# Patient Record
Sex: Male | Born: 2000 | Race: Black or African American | Hispanic: No | Marital: Single | Smoking: Current some day smoker
Health system: Southern US, Community
[De-identification: ages and names within clinical notes are randomized; demographics above are authoritative.]

## PROBLEM LIST (undated history)

## (undated) DIAGNOSIS — F329 Major depressive disorder, single episode, unspecified: Secondary | ICD-10-CM

## (undated) DIAGNOSIS — J302 Other seasonal allergic rhinitis: Secondary | ICD-10-CM

## (undated) DIAGNOSIS — J45909 Unspecified asthma, uncomplicated: Secondary | ICD-10-CM

---
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
## 2022-10-04 ENCOUNTER — Emergency Department (HOSPITAL_COMMUNITY)
Admission: EM | Admit: 2022-10-04 | Discharge: 2022-10-04 | Disposition: A | Discharge status: Home / Self Care | Payer: Self-pay | Attending: Emergency Medicine | Admitting: Emergency Medicine

## 2022-10-04 ENCOUNTER — Other Ambulatory Visit: Payer: Self-pay

## 2022-10-04 DIAGNOSIS — J45909 Unspecified asthma, uncomplicated: Secondary | ICD-10-CM | POA: Insufficient documentation

## 2022-10-04 DIAGNOSIS — F17200 Nicotine dependence, unspecified, uncomplicated: Secondary | ICD-10-CM | POA: Insufficient documentation

## 2022-10-04 DIAGNOSIS — J029 Acute pharyngitis, unspecified: Secondary | ICD-10-CM | POA: Insufficient documentation

## 2022-10-04 LAB — GROUP A STREP BY PCR: Group A Strep by PCR: NOT DETECTED

## 2022-10-04 MED ORDER — HYDROXYZINE HCL 25 MG PO TABS: 25.0000 mg | ORAL_TABLET | Freq: Four times a day (QID) | ORAL | 0 refills | Status: AC | PRN

## 2022-10-04 MED ORDER — HYDROXYZINE HCL 25 MG PO TABS
25.0000 mg | ORAL_TABLET | Freq: Once | ORAL | Status: DC
  Filled 2022-10-04: qty 1

## 2022-10-04 MED ORDER — ALUM & MAG HYDROXIDE-SIMETH 200-200-20 MG/5ML PO SUSP
30.0000 mL | Freq: Once | ORAL | Status: AC
  Administered 2022-10-04: 30 mL via ORAL
  Filled 2022-10-04: qty 30

## 2022-10-04 MED ORDER — FAMOTIDINE 20 MG PO TABS
20.0000 mg | ORAL_TABLET | Freq: Once | ORAL | Status: AC
  Administered 2022-10-04: 20 mg via ORAL
  Filled 2022-10-04: qty 1

## 2022-10-04 MED ORDER — LIDOCAINE VISCOUS HCL 2 % MT SOLN
15.0000 mL | Freq: Once | OROMUCOSAL | Status: AC
  Administered 2022-10-04: 15 mL via ORAL
  Filled 2022-10-04: qty 15

## 2022-10-04 MED ORDER — FAMOTIDINE 20 MG PO TABS: 20.0000 mg | ORAL_TABLET | Freq: Every day | ORAL | 0 refills | Status: AC

## 2022-10-04 NOTE — ED Triage Notes (Signed)
BIBA with c/o anxiety x "few days".  Pt states "I feel like I have a knot in my throat" Ems reports labor respirations and tachycardia on arrival but has improved.  Called ems 3 days ago for same but declined to come to hospital.

## 2022-10-04 NOTE — ED Provider Notes (Signed)
[place] EMERGENCY DEPARTMENT AT [facility] Provider Note   CSN: [id number] Arrival date & time: 10/04/22  1420     History  Chief Complaint  Patient presents with   Anxiety    Steve Mueller is a 22 y.o. male.   Anxiety   22 year old male presents emergency department with complaints of sore throat.  Patient states that he had sore throat since Monday or Tuesday of this week.  States he has history of anxiety and has had increased life stressors at work and generally.  States he tried no medication for his sore throat.  Denies feelings of throat closing on him.  Denies nasal congestion, cough, chest pain, shortness of breath, fever, chills, night sweats.  States he is unaware of whether or not this is "just my anxiety or if something else is going on."  Denies any suicidal, homicidal ideation, auditory/visual hallucinations.  Past medical history significant for depression disorder, seasonal allergies, asthma, suicide attempt via Tylenol ingestion  Home Medications Prior to Admission medications   Medication Sig Start Date End Date Taking? Authorizing Provider  famotidine (PEPCID) 20 MG tablet Take 1 tablet (20 mg total) by mouth daily. 10/04/22  Yes Sherian Maroon A, PA  hydrOXYzine (ATARAX) 25 MG tablet Take 1 tablet (25 mg total) by mouth every 6 (six) hours as needed for anxiety. 10/04/22  Yes Sherian Maroon A, PA  albuterol (PROVENTIL HFA;VENTOLIN HFA) 108 (90 Base) MCG/ACT inhaler Inhale 1 puff into the lungs every 6 (six) hours as needed for wheezing or shortness of breath.    [provider]      Allergies    Patient has no known allergies.    Review of Systems   Review of Systems  All other systems reviewed and are negative.   Physical Exam Updated Vital Signs BP 138/78   Pulse 90   Temp 98.2 F (36.8 C)   Resp 18   Wt 81 kg   SpO2 100%  Physical Exam Vitals and nursing note reviewed.  Constitutional:      General: He is not in acute  distress.    Appearance: He is well-developed.  HENT:     Head: Normocephalic and atraumatic.     Right Ear: Tympanic membrane, ear canal and external ear normal.     Left Ear: Tympanic membrane, ear canal and external ear normal.     Nose: No congestion or rhinorrhea.     Mouth/Throat:     Comments: Mild posterior pharyngeal erythema.  Tonsils 0-1+ bilaterally with no obvious exudate.  Uvula midline rise symmetric with phonation.  No sublingual or submandibular swelling appreciated. Eyes:     Conjunctiva/sclera: Conjunctivae normal.  Cardiovascular:     Rate and Rhythm: Normal rate and regular rhythm.     Heart sounds: No murmur heard. Pulmonary:     Effort: Pulmonary effort is normal. No respiratory distress.     Breath sounds: Normal breath sounds.  Abdominal:     Palpations: Abdomen is soft.     Tenderness: There is no abdominal tenderness.  Musculoskeletal:        General: No swelling.     Cervical back: Neck supple.     Right lower leg: No edema.     Left lower leg: No edema.  Skin:    General: Skin is warm and dry.     Capillary Refill: Capillary refill takes less than 2 seconds.  Neurological:     Mental Status: He is alert.  Psychiatric:  Mood and Affect: Mood normal.     ED Results / Procedures / Treatments   Labs (all labs ordered are listed, but only abnormal results are displayed) Labs Reviewed  GROUP A STREP BY PCR    EKG None  Radiology No results found.  Procedures Procedures    Medications Ordered in ED Medications  alum & mag hydroxide-simeth (MAALOX/MYLANTA) 200-200-20 MG/5ML suspension 30 mL (30 mLs Oral Given 10/04/22 1451)    And  lidocaine (XYLOCAINE) 2 % viscous mouth solution 15 mL (15 mLs Oral Given 10/04/22 1451)  famotidine (PEPCID) tablet 20 mg (20 mg Oral Given 10/04/22 1450)    ED Course/ Medical Decision Making/ A&P                             Medical Decision Making Risk OTC drugs. Prescription drug  management.   This patient presents to the ED for concern of sore throat, this involves an extensive number of treatment options, and is a complaint that carries with it a high risk of complications and morbidity.  The differential diagnosis includes viral pharyngitis, strep pharyngitis, COVID, influenza, RSV, Ludwig angina, Lemierre's disease   Co morbidities that complicate the patient evaluation  See HPI   Additional history obtained:  Additional history obtained from EMR External records from outside source obtained and reviewed including hospital records   Lab Tests:  I Ordered, and personally interpreted labs.  The pertinent results include: Group A strep negative   Imaging Studies ordered:  N/a   Cardiac Monitoring: / EKG:  The patient was maintained on a cardiac monitor.  I personally viewed and interpreted the cardiac monitored which showed an underlying rhythm of: Sinus rhythm   Consultations Obtained:  N/a   Problem List / ED Course / Critical interventions / Medication management  Sore throat I ordered medication including Maalox, Pepcid, lidocaine   Reevaluation of the patient after these medicines showed that the patient improved I have reviewed the patients home medicines and have made adjustments as needed   Social Determinants of Health:  Reports some tobacco.  Denies illicit drug use   Test / Admission - Considered:  Sore throat Vitals signs significant for mild hypertension with blood pressure 147/85. Otherwise within normal range and stable throughout visit. Laboratory studies significant for: See above 22 year old male presents emergency department with complaints of sore throat.  Patient with evidence of mild posterior pharyngeal erythema.  Most likely viral pharyngitis.  Consider testing for mononucleosis but patient declined at this time.  No clinical evidence for Lemierre's disease, Ludwig angina, necrotizing ulcerative gingivitis,  peritonsillar abscess.  Patient with negative testing for group A strep.  Will treat at home with Motrin/Tylenol for pain, Cepacol throat lozenges, Nasacort nasal spray and follow-up with primary care.  Patient desired treatment for anxiety of which she will be given hydroxyzine to use as needed.  Patient overall well-appearing, afebrile in no acute distress, tolerating p.o. without difficulty.  Treatment plan discussed at length with patient and he acknowledged understanding was agreeable to said plan. Worrisome signs and symptoms were discussed with the patient, and the patient acknowledged understanding to return to the ED if noticed. Patient was stable upon discharge.          Final Clinical Impression(s) / ED Diagnoses Final diagnoses:  Sore throat    Rx / DC Orders ED Discharge Orders          Ordered    hydrOXYzine (  ATARAX) 25 MG tablet  Every 6 hours PRN        10/04/22 1628    famotidine (PEPCID) 20 MG tablet  Daily        10/04/22 1628              Peter Garter, [place] 10/04/22 1816    Sloan Leiter, DO 10/05/22 1047

## 2022-10-04 NOTE — Discharge Instructions (Signed)
As discussed, workup today overall reassuring.  You tested negative for strep.  Will send in medication to take as needed for anxious feelings, hydroxyzine.  Recommend taking reflux medicine such as Pepcid daily given improvement of symptoms with medicines while in the emergency department.  Also recommend over-the-counter Cepacol throat lozenges as well as Tylenol/Motrin as needed for pain..  Recommend follow-up with primary care for reassessment of your symptoms.  Please do not hesitate to return to emergency department for worrisome signs or symptoms we discussed become apparent.

## 2023-03-05 ENCOUNTER — Ambulatory Visit: Payer: Self-pay

## 2023-03-05 DIAGNOSIS — Z23 Encounter for immunization: Secondary | ICD-10-CM

## 2023-11-12 ENCOUNTER — Telehealth: Payer: Self-pay | Admitting: Physician Assistant

## 2023-11-12 DIAGNOSIS — J4531 Mild persistent asthma with (acute) exacerbation: Secondary | ICD-10-CM

## 2023-11-12 MED ORDER — PREDNISONE 20 MG PO TABS: 40.0000 mg | ORAL_TABLET | Freq: Every day | ORAL | 0 refills | Status: AC

## 2023-11-12 NOTE — Progress Notes (Signed)
 E-Visit for Asthma  Based on what you have shared with me, it looks like you may have a flare up of your asthma.  Asthma is a chronic (ongoing) lung disease which results in airway obstruction, inflammation and hyper-responsiveness.   Asthma symptoms vary from person to person, with common symptoms including nighttime awakening and decreased ability to participate in normal activities as a result of shortness of breath. It is often triggered by changes in weather, changes in the season, changes in air temperature, or inside (home, school, daycare or work) allergens such as animal dander, mold, mildew, woodstoves or cockroaches.   It can also be triggered by hormonal changes, extreme emotion, physical exertion or an upper respiratory tract illness.     It is important to identify the trigger, and then eliminate or avoid the trigger if possible.   If you have been prescribed medications to be taken on a regular basis, it is important to follow the asthma action plan and to follow guidelines to adjust medication in response to increasing symptoms of decreased peak expiratory flow rate  Treatment: I have prescribed: Prednisone 40mg  by mouth per day for 5 - 7 days  HOME CARE Only take medications as instructed by your medical team. Consider wearing a mask or scarf to improve breathing air temperature have been shown to decrease irritation and decrease exacerbations Get rest. Taking a steamy shower or using a humidifier may help nasal congestion sand ease sore throat pain. You can place a towel over your head and breathe in the steam from hot water coming from a faucet. Using a saline nasal spray works much the same way.  Cough drops, hare candies and sore throat lozenges may ease your cough.  Avoid close contacts especially the very you and the elderly Cover your mouth if you cough or  sneeze Always remember to wash your hands.    GET HELP RIGHT AWAY IF: You develop worsening symptoms; breathlessness at rest, drowsy, confused or agitated, unable to speak in full sentences You have coughing fits You develop a severe headache or visual changes You develop shortness of breath, difficulty breathing or start having chest pain Your symptoms persist after you have completed your treatment plan If your symptoms do not improve within 10 days  MAKE SURE YOU Understand these instructions. Will watch your condition. Will get help right away if you are not doing well or get worse.   Your e-visit answers were reviewed by a board certified advanced clinical practitioner to complete your personal care plan, Depending upon the condition, your plan could have included both over the counter or prescription medications.   Please review your pharmacy choice. Your safety is important to [place]. If you have drug allergies check your prescription carefully.  You can use MyChart to ask questions about today's visit, request a non-urgent  call back, or ask for a work or school excuse for 24 hours related to this e-Visit. If it has been greater than 24 hours you will need to follow up with your provider, or enter a new e-Visit to address those concerns.   You will get an e-mail in the next two days asking about your experience. I hope that your e-visit has been valuable and will speed your recovery. Thank you for using e-visits.  I have spent 5 minutes in review of e-visit questionnaire, review and updating patient chart, medical decision making and response to patient.   Margaretann Loveless, PA-C
